# Patient Record
Sex: Female | Born: 1995 | Race: Black or African American | Hispanic: No | Marital: Single | Smoking: Never smoker
Health system: Southern US, Community
[De-identification: ages and names within clinical notes are randomized; demographics above are authoritative.]

## PROBLEM LIST (undated history)

## (undated) DIAGNOSIS — K8020 Calculus of gallbladder without cholecystitis without obstruction: Secondary | ICD-10-CM

## (undated) DIAGNOSIS — I10 Essential (primary) hypertension: Secondary | ICD-10-CM

---

## 2014-06-25 ENCOUNTER — Encounter (HOSPITAL_BASED_OUTPATIENT_CLINIC_OR_DEPARTMENT_OTHER): Payer: Self-pay | Admitting: Emergency Medicine

## 2014-06-25 ENCOUNTER — Emergency Department (HOSPITAL_BASED_OUTPATIENT_CLINIC_OR_DEPARTMENT_OTHER)
Admission: EM | Admit: 2014-06-25 | Discharge: 2014-06-25 | Disposition: A | Discharge status: Home / Self Care | Payer: BC Managed Care – PPO | Attending: Emergency Medicine | Admitting: Emergency Medicine

## 2014-06-25 DIAGNOSIS — I10 Essential (primary) hypertension: Secondary | ICD-10-CM | POA: Insufficient documentation

## 2014-06-25 DIAGNOSIS — R21 Rash and other nonspecific skin eruption: Secondary | ICD-10-CM | POA: Diagnosis present

## 2014-06-25 HISTORY — DX: Essential (primary) hypertension: I10

## 2014-06-25 LAB — RAPID STREP SCREEN (MED CTR MEBANE ONLY): STREPTOCOCCUS, GROUP A SCREEN (DIRECT): NEGATIVE

## 2014-06-25 MED ORDER — PREDNISONE 10 MG PO TABS: ORAL_TABLET | ORAL | Status: DC

## 2014-06-25 NOTE — ED Provider Notes (Signed)
CSN: [id number]     Arrival date & time 06/25/14  1621 History   First MD Initiated Contact with Patient 06/25/14 1725     Chief Complaint  Patient presents with  . Rash     (Consider location/radiation/quality/duration/timing/severity/associated sxs/prior Treatment) Patient is a 18 y.o. female presenting with rash. The history is provided by the patient. No language interpreter was used.  Rash Location:  Full body Quality: itchiness and redness   Severity:  Moderate Onset quality:  Gradual Duration:  6 days Timing:  Constant Progression:  Worsening Chronicity:  New Context: not insect bite/sting   Relieved by:  Nothing Worsened by:  Nothing tried Ineffective treatments:  None tried Associated symptoms: no throat swelling     Past Medical History  Diagnosis Date  . Hypertension    History reviewed. No pertinent past surgical history. No family history on file. History  Substance Use Topics  . Smoking status: Never Smoker   . Smokeless tobacco: Not on file  . Alcohol Use: No   OB History   Grav Para Term Preterm Abortions TAB SAB Ect Mult Living                 Review of Systems  Skin: Positive for rash.  All other systems reviewed and are negative.     Allergies  Review of patient's allergies indicates no known allergies.  Home Medications   Prior to Admission medications   Not on File   BP 153/87  Pulse 80  Temp(Src) 98 F (36.7 C) (Oral)  Resp 18  Wt 180 lb (81.647 kg)  SpO2 100%  LMP 06/11/2014 Physical Exam  Nursing note and vitals reviewed. Constitutional: She is oriented to person, place, and time. She appears well-developed and well-nourished.  HENT:  Head: Normocephalic.  Eyes: Pupils are equal, round, and reactive to light.  Neck: Normal range of motion.  Cardiovascular: Normal rate.   Pulmonary/Chest: Effort normal.  Abdominal: Soft.  Neurological: She is alert and oriented to person, place, and time.  Skin: Rash noted.  Raised,  erythematous rash, fine,    Psychiatric: She has a normal mood and affect.    ED Course  Procedures (including critical care time) Labs Review Labs Reviewed  RAPID STREP SCREEN  CULTURE, GROUP A STREP    Imaging Review No results found. Rash looks like strep,   Strep is negative,   Probable allergic.  I wll treat with prednisone  EKG Interpretation None      MDM   Final diagnoses:  Rash and nonspecific skin eruption    Strep negative Prednisone taper    Elson Areas, PA-C 06/25/14 2241

## 2014-06-25 NOTE — Discharge Instructions (Signed)

## 2014-06-25 NOTE — ED Notes (Signed)
Rash since Tuesday. No relief with benadryl

## 2014-06-27 LAB — CULTURE, GROUP A STREP

## 2014-07-03 NOTE — ED Provider Notes (Signed)
Medical screening examination/treatment/procedure(s) were performed by non-physician practitioner and as supervising physician I was immediately available for consultation/collaboration.   EKG Interpretation None        Haruna Rohlfs, MD 07/03/14 1504 

## 2016-07-31 DIAGNOSIS — R351 Nocturia: Secondary | ICD-10-CM | POA: Diagnosis not present

## 2016-07-31 DIAGNOSIS — R42 Dizziness and giddiness: Secondary | ICD-10-CM | POA: Diagnosis not present

## 2017-11-18 DIAGNOSIS — K8020 Calculus of gallbladder without cholecystitis without obstruction: Secondary | ICD-10-CM | POA: Diagnosis not present

## 2017-11-18 DIAGNOSIS — R1011 Right upper quadrant pain: Secondary | ICD-10-CM | POA: Diagnosis not present

## 2017-11-20 DIAGNOSIS — K8012 Calculus of gallbladder with acute and chronic cholecystitis without obstruction: Secondary | ICD-10-CM | POA: Diagnosis not present

## 2017-12-01 DIAGNOSIS — I10 Essential (primary) hypertension: Secondary | ICD-10-CM | POA: Diagnosis not present

## 2017-12-01 DIAGNOSIS — K8020 Calculus of gallbladder without cholecystitis without obstruction: Secondary | ICD-10-CM | POA: Insufficient documentation

## 2017-12-01 DIAGNOSIS — R1011 Right upper quadrant pain: Secondary | ICD-10-CM | POA: Diagnosis not present

## 2017-12-01 DIAGNOSIS — R109 Unspecified abdominal pain: Secondary | ICD-10-CM | POA: Diagnosis not present

## 2017-12-01 DIAGNOSIS — K8050 Calculus of bile duct without cholangitis or cholecystitis without obstruction: Secondary | ICD-10-CM | POA: Diagnosis not present

## 2017-12-02 ENCOUNTER — Other Ambulatory Visit: Payer: Self-pay

## 2017-12-02 ENCOUNTER — Encounter (HOSPITAL_BASED_OUTPATIENT_CLINIC_OR_DEPARTMENT_OTHER): Payer: Self-pay

## 2017-12-02 ENCOUNTER — Emergency Department (HOSPITAL_BASED_OUTPATIENT_CLINIC_OR_DEPARTMENT_OTHER)
Admission: EM | Admit: 2017-12-02 | Discharge: 2017-12-02 | Disposition: A | Discharge status: Home / Self Care | Payer: BLUE CROSS/BLUE SHIELD | Attending: Emergency Medicine | Admitting: Emergency Medicine

## 2017-12-02 DIAGNOSIS — K8050 Calculus of bile duct without cholangitis or cholecystitis without obstruction: Secondary | ICD-10-CM

## 2017-12-02 HISTORY — DX: Calculus of gallbladder without cholecystitis without obstruction: K80.20

## 2017-12-02 LAB — CBC WITH DIFFERENTIAL/PLATELET
Basophils Absolute: 0 10*3/uL (ref 0.0–0.1)
Basophils Relative: 0 %
Eosinophils Absolute: 0.2 10*3/uL (ref 0.0–0.7)
Eosinophils Relative: 2 %
HEMATOCRIT: 35.3 % — AB (ref 36.0–46.0)
Hemoglobin: 11.5 g/dL — ABNORMAL LOW (ref 12.0–15.0)
Lymphocytes Relative: 47 %
Lymphs Abs: 3.4 10*3/uL (ref 0.7–4.0)
MCH: 28 pg (ref 26.0–34.0)
MCHC: 32.6 g/dL (ref 30.0–36.0)
MCV: 86.1 fL (ref 78.0–100.0)
Monocytes Absolute: 0.6 10*3/uL (ref 0.1–1.0)
Monocytes Relative: 8 %
NEUTROS ABS: 3.1 10*3/uL (ref 1.7–7.7)
NEUTROS PCT: 43 %
Platelets: 352 10*3/uL (ref 150–400)
RBC: 4.1 MIL/uL (ref 3.87–5.11)
RDW: 13.2 % (ref 11.5–15.5)
WBC: 7.3 10*3/uL (ref 4.0–10.5)

## 2017-12-02 LAB — COMPREHENSIVE METABOLIC PANEL
ALK PHOS: 51 U/L (ref 38–126)
ALT: 13 U/L — ABNORMAL LOW (ref 14–54)
AST: 15 U/L (ref 15–41)
Albumin: 3.7 g/dL (ref 3.5–5.0)
Anion gap: 11 (ref 5–15)
BUN: 10 mg/dL (ref 6–20)
CALCIUM: 8.6 mg/dL — AB (ref 8.9–10.3)
CO2: 22 mmol/L (ref 22–32)
Chloride: 104 mmol/L (ref 101–111)
Creatinine, Ser: 0.46 mg/dL (ref 0.44–1.00)
GFR calc Af Amer: >60 mL/min (ref >60)
GFR calc non Af Amer: >60 mL/min (ref >60)
Glucose, Bld: 122 mg/dL — ABNORMAL HIGH (ref 65–99)
Potassium: 3.1 mmol/L — ABNORMAL LOW (ref 3.5–5.1)
Sodium: 137 mmol/L (ref 135–145)
Total Bilirubin: 0.2 mg/dL — ABNORMAL LOW (ref 0.3–1.2)
Total Protein: 7.3 g/dL (ref 6.5–8.1)

## 2017-12-02 LAB — LIPASE, BLOOD: Lipase: 26 U/L (ref 11–51)

## 2017-12-02 MED ORDER — HYDROMORPHONE HCL 1 MG/ML IJ SOLN
1.0000 mg | Freq: Once | INTRAMUSCULAR | Status: AC
  Administered 2017-12-02: 1 mg via INTRAVENOUS
  Filled 2017-12-02: qty 1

## 2017-12-02 MED ORDER — KETOROLAC TROMETHAMINE 30 MG/ML IJ SOLN
30.0000 mg | Freq: Once | INTRAMUSCULAR | Status: AC
  Administered 2017-12-02: 30 mg via INTRAVENOUS
  Filled 2017-12-02: qty 1

## 2017-12-02 MED ORDER — ONDANSETRON HCL 4 MG/2ML IJ SOLN
4.0000 mg | Freq: Once | INTRAMUSCULAR | Status: AC
  Administered 2017-12-02: 4 mg via INTRAVENOUS
  Filled 2017-12-02: qty 2

## 2017-12-02 MED ORDER — HYDROCODONE-ACETAMINOPHEN 5-325 MG PO TABS: 1.0000 | ORAL_TABLET | ORAL | 0 refills | Status: DC | PRN

## 2017-12-02 MED ORDER — SODIUM CHLORIDE 0.9 % IV BOLUS (SEPSIS)
1000.0000 mL | Freq: Once | INTRAVENOUS | Status: AC
  Administered 2017-12-02: 1000 mL via INTRAVENOUS

## 2017-12-02 MED ORDER — PROMETHAZINE HCL 25 MG PO TABS: 25.0000 mg | ORAL_TABLET | Freq: Four times a day (QID) | ORAL | 0 refills | Status: DC | PRN

## 2017-12-02 NOTE — ED Triage Notes (Signed)
Chronic pain r/t gallstones, is supposed to have surgery but does not have the money, pt states the pain got worse today and she was vomiting as well

## 2017-12-02 NOTE — ED Provider Notes (Signed)
[facility] EMERGENCY DEPARTMENT Provider Note   CSN: [id number] Arrival date & time: 12/01/17  2350     History   Chief Complaint Chief Complaint  Patient presents with  . Abdominal Pain    HPI Amber Walters is a 22 y.o. female.  She presents to the ER for evaluation of abdominal pain.  Patient has a known gallstone.  Patient reports right upper and central abdominal pain radiating into her back.  Pain is severe.  She reports that she has been having intermittent pain similar to this when she eats and she was diagnosed, however, tonight the pain was worse.      Past Medical History:  Diagnosis Date  . Gallstones   . Hypertension     There are no active problems to display for this patient.   History reviewed. No pertinent surgical history.  OB History    No data available       Home Medications    Prior to Admission medications   Medication Sig Start Date End Date Taking? Authorizing Provider  predniSONE (DELTASONE) 10 MG tablet 6,5,4,3,2,1 taper 06/25/14   Elson AreasSofia, Leslie K, PA-C    Family History No family history on file.  Social History Social History   Tobacco Use  . Smoking status: Never Smoker  Substance Use Topics  . Alcohol use: No  . Drug use: Not on file     Allergies   Patient has no known allergies.   Review of Systems Review of Systems  Gastrointestinal: Positive for abdominal pain.  All other systems reviewed and are negative.    Physical Exam Updated Vital Signs BP (!) 147/87 (BP Location: Left Arm)   Pulse 89   Temp 99 F (37.2 C) (Oral)   Resp 18   Ht 5' (1.524 m)   Wt 89.4 kg (197 lb)   LMP 11/06/2017   SpO2 100%   BMI 38.47 kg/m   Physical Exam  Constitutional: She is oriented to person, place, and time. She appears well-developed and well-nourished. No distress.  HENT:  Head: Normocephalic and atraumatic.  Right Ear: Hearing normal.  Left Ear: Hearing normal.  Nose: Nose normal.    Mouth/Throat: Oropharynx is clear and moist and mucous membranes are normal.  Eyes: Conjunctivae and EOM are normal. Pupils are equal, round, and reactive to light.  Neck: Normal range of motion. Neck supple.  Cardiovascular: Regular rhythm, S1 normal and S2 normal. Exam reveals no gallop and no friction rub.  No murmur heard. Pulmonary/Chest: Effort normal and breath sounds normal. No respiratory distress. She exhibits no tenderness.  Abdominal: Soft. Normal appearance and bowel sounds are normal. There is no hepatosplenomegaly. There is tenderness in the right upper quadrant and epigastric area. There is no rebound, no guarding, no tenderness at McBurney's point and negative Murphy's sign. No hernia.  Musculoskeletal: Normal range of motion.  Neurological: She is alert and oriented to person, place, and time. She has normal strength. No cranial nerve deficit or sensory deficit. Coordination normal. GCS eye subscore is 4. GCS verbal subscore is 5. GCS motor subscore is 6.  Skin: Skin is warm, dry and intact. No rash noted. No cyanosis.  Psychiatric: She has a normal mood and affect. Her speech is normal and behavior is normal. Thought content normal.  Nursing note and vitals reviewed.    ED Treatments / Results  Labs (all labs ordered are listed, but only abnormal results are displayed) Labs Reviewed  COMPREHENSIVE METABOLIC PANEL - Abnormal;  Notable for the following components:      Result Value   Potassium 3.1 (*)    Glucose, Bld 122 (*)    Calcium 8.6 (*)    ALT 13 (*)    Total Bilirubin 0.2 (*)    All other components within normal limits  CBC WITH DIFFERENTIAL/PLATELET - Abnormal; Notable for the following components:   Hemoglobin 11.5 (*)    HCT 35.3 (*)    All other components within normal limits  LIPASE, BLOOD    EKG  EKG Interpretation None       Radiology No results found.  Procedures Procedures (including critical care time)  Medications Ordered in  ED Medications  sodium chloride 0.9 % bolus 1,000 mL (0 mLs Intravenous Stopped 12/02/17 0134)  HYDROmorphone (DILAUDID) injection 1 mg (1 mg Intravenous Given 12/02/17 0042)  ondansetron (ZOFRAN) injection 4 mg (4 mg Intravenous Given 12/02/17 0042)  ketorolac (TORADOL) 30 MG/ML injection 30 mg (30 mg Intravenous Given 12/02/17 0147)  HYDROmorphone (DILAUDID) injection 1 mg (1 mg Intravenous Given 12/02/17 0147)     Initial Impression / Assessment and Plan / ED Course  I have reviewed the triage vital signs and the nursing notes.  Pertinent labs & imaging results that were available during my care of the patient were reviewed by me and considered in my medical decision making (see chart for details).     Patient presents with upper abdominal pain.  She has a known gallstone.  Examination and presentation are consistent with biliary colic.  She did not have a Murphy sign.  She does not have a fever.  White blood cell count, LFTs and lipase are all normal.  Patient is now pain-free after medication.  She was once again counseled that she needs to follow-up with general surgery to have her gallbladder removed.  Follow a strict gallstone diet.  Was given a prescription for Vicodin to be used as needed.  Final Clinical Impressions(s) / ED Diagnoses   Final diagnoses:  Biliary colic    ED Discharge Orders    None       Gilda Crease, MD 12/02/17 [phone]

## 2017-12-02 NOTE — ED Notes (Signed)
ED Provider at bedside. 

## 2017-12-02 NOTE — ED Notes (Signed)
Pt reports she has an appt Friday to get second opinion from surgeon.

## 2017-12-02 NOTE — ED Notes (Signed)
Pt given "d/c instructions" on proper foods to eat and avoid with gallstones.

## 2017-12-04 ENCOUNTER — Ambulatory Visit: Payer: Self-pay | Admitting: Surgery

## 2017-12-04 DIAGNOSIS — K8010 Calculus of gallbladder with chronic cholecystitis without obstruction: Secondary | ICD-10-CM | POA: Diagnosis not present

## 2017-12-04 DIAGNOSIS — Z0000 Encounter for general adult medical examination without abnormal findings: Secondary | ICD-10-CM | POA: Diagnosis not present

## 2017-12-04 DIAGNOSIS — Z23 Encounter for immunization: Secondary | ICD-10-CM | POA: Diagnosis not present

## 2017-12-04 NOTE — H&P (Signed)
Amber Walters Documented: 12/04/2017 10:02 AM Location: [facility] Patient #: [id number] DOB: [birth year] Single / Language: [place] / Race: Black or African American Female  History of Present Illness Amber Walters A. Amber Lutz MD; 12/04/2017 11:37 AM) Patient words: Patient sent at the request emergency room for history of urinary colic. She is 2 months history of right upper quadrant pain, nausea and vomiting after eating fatty meals. Ultrasound done on 11/18/2017 shows a large gallstone with no signs of acute cholecystitis. Her symptoms are controlled if she has a low fat diet. Denies any other symptomatology of back pain chest pain or shortness of breath.          CLINICAL DATA: Right upper quadrant pain for 1 month.  EXAM: ULTRASOUND ABDOMEN LIMITED RIGHT UPPER QUADRANT  COMPARISON: None.  FINDINGS: Gallbladder:  A 1.4 cm gallstone is seen in the gallbladder neck, as well as a small amount of gallbladder sludge. No evidence of gallbladder dilatation or wall thickening. No sonographic Murphy sign noted by sonographer.  Common bile duct:  Diameter: 2 mm, within normal limits.  Liver:  No focal lesion identified. Within normal limits in parenchymal echogenicity. Portal vein is patent on color Doppler imaging with normal direction of blood flow towards the liver.  IMPRESSION: Cholelithiasis, without sonographic signs of acute cholecystitis or biliary dilatation.   Electronically Signed By: Myles Rosenthal M.D. On: 11/18/2017 10:05            CLINICAL DATA: Right upper quadrant pain for 1 month.  EXAM: ULTRASOUND ABDOMEN LIMITED RIGHT UPPER QUADRANT  COMPARISON: None.  FINDINGS: Gallbladder:  A 1.4 cm gallstone is seen in the gallbladder neck, as well as a small amount of gallbladder sludge. No evidence of gallbladder dilatation or wall thickening. No sonographic Murphy sign noted by sonographer.  Common bile duct:  Diameter: 2  mm, within normal limits.  Liver:  No focal lesion identified. Within normal limits in parenchymal echogenicity. Portal vein is patent on color Doppler imaging with normal direction of blood flow towards the liver.  IMPRESSION: Cholelithiasis, without sonographic signs of acute cholecystitis or biliary dilatation.   Electronically Signed By: Myles Rosenthal M.D. On: 11/18/2017 10:05   Results for Amber Walters (MRN [id number]) as of 12/04/2017 10:11 Ref. Range 12/02/2017 00:38 Sodium Latest Ref Range: 135 - 145 mmol/L 137 Potassium Latest Ref Range: 3.5 - 5.1 mmol/L 3.1 (L) Chloride Latest Ref Range: 101 - 111 mmol/L 104 CO2 Latest Ref Range: 22 - 32 mmol/L 22 Glucose Latest Ref Range: 65 - 99 mg/dL 213 (H) BUN Latest Ref Range: 6 - 20 mg/dL 10 Creatinine Latest Ref Range: 0.44 - 1.00 mg/dL 0.86 Calcium Latest Ref Range: 8.9 - 10.3 mg/dL 8.6 (L) Anion gap Latest Ref Range: 5 - 15 11 Alkaline Phosphatase Latest Ref Range: 38 - 126 U/L 51 Albumin Latest Ref Range: 3.5 - 5.0 g/dL 3.7 Lipase Latest Ref Range: 11 - 51 U/L 26 AST Latest Ref Range: 15 - 41 U/L 15 ALT Latest Ref Range: 14 - 54 U/L 13 (L) Total Protein Latest Ref Range: 6.5 - 8.1 g/dL 7.3 Total Bilirubin Latest Ref Range: 0.3 - 1.2 mg/dL 0.2 (L) GFR, Est Non African American Latest Ref Range: >60 mL/min >60 GFR, Est African American Latest Ref Range: >60 mL/min >60 WBC Latest Ref Range: 4.0 - 10.5 K/uL 7.3 RBC Latest Ref Range: 3.87 - 5.11 MIL/uL 4.10 Hemoglobin Latest Ref Range: 12.0 - 15.0 g/dL 57.8 (L) HCT Latest Ref Range: 36.0 - 46.0 % 35.3 (L)  MCV Latest Ref Range: 78.0 - 100.0 fL 86.1 MCH Latest Ref Range: 26.0 - 34.0 pg 28.0 MCHC Latest Ref Range: 30.0 - 36.0 g/dL 08.6 RDW Latest Ref Range: 11.5 - 15.5 % 13.2 Platelets Latest Ref Range: 150 - 400 K/uL 352 Neutrophils Latest Units: % 43 Lymphocytes Latest Units: % 47 Monocytes Relative Latest Units: % 8 Eosinophil Latest Units: % 2 Basophil Latest  Units: % 0 NEUT# Latest Ref Range: 1.7 - 7.7 K/uL 3.1 Lymphocyte # Latest Ref Range: 0.7 - 4.0 K/uL 3.4 Monocyte # Latest Ref Range: 0.1 - 1.0 K/uL 0.6 Eosinophils Absolute Latest Ref Range: 0.0 - 0.7 K/uL 0.2 Basophils Absolute Latest Ref Range: 0.0 - 0.1 K/uL 0.0.  The patient is a 22 year old female.   Past Surgical History Judithann Sauger, [place]; 12/04/2017 10:03 AM) No pertinent past surgical history  Diagnostic Studies History Judithann Sauger, [place]; 12/04/2017 10:03 AM) Colonoscopy never Mammogram never Pap Smear 1-5 years ago  Allergies Judithann Sauger, RMA; 12/04/2017 10:04 AM) No Known Drug Allergies [12/04/2017]: Allergies Reconciled  Medication History Judithann Sauger, [place]; 12/04/2017 10:05 AM) Hydrocodone-Acetaminophen (5-325MG  Tablet, Oral) Active. Promethazine HCl (25MG  Tablet, Oral) Active. Medications Reconciled  Social History Judithann Sauger, [place]; 12/04/2017 10:03 AM) Alcohol use Occasional alcohol use. Caffeine use Carbonated beverages. No drug use Tobacco use Never smoker.  Family History Judithann Sauger, [place]; 12/04/2017 10:03 AM) Diabetes Mellitus Father. Hypertension Father.  Pregnancy / Birth History Judithann Sauger, [place]; 12/04/2017 10:03 AM) Age at menarche 11 years. Gravida 0 Para 0 Regular periods  Other Problems Judithann Sauger, RMA; 12/04/2017 10:03 AM) High blood pressure     Review of Systems Judithann Sauger RMA; 12/04/2017 10:03 AM) General Not Present- Appetite Loss, Chills, Fatigue, Fever, Night Sweats, Weight Gain and Weight Loss. Skin Not Present- Change in Wart/Mole, Dryness, Hives, Jaundice, New Lesions, Non-Healing Wounds, Rash and Ulcer. HEENT Not Present- Earache, Hearing Loss, Hoarseness, Nose Bleed, Oral Ulcers, Ringing in the Ears, Seasonal Allergies, Sinus Pain, Sore Throat, Visual Disturbances, Wears glasses/contact lenses and Yellow Eyes. Respiratory Not Present- Bloody sputum, Chronic Cough, Difficulty Breathing,  Snoring and Wheezing. Breast Not Present- Breast Mass, Breast Pain, Nipple Discharge and Skin Changes. Cardiovascular Not Present- Chest Pain, Difficulty Breathing Lying Down, Leg Cramps, Palpitations, Rapid Heart Rate, Shortness of Breath and Swelling of Extremities. Gastrointestinal Present- Abdominal Pain. Not Present- Bloating, Bloody Stool, Change in Bowel Habits, Chronic diarrhea, Constipation, Difficulty Swallowing, Excessive gas, Gets full quickly at meals, Hemorrhoids, Indigestion, Nausea, Rectal Pain and Vomiting. Female Genitourinary Not Present- Frequency, Nocturia, Painful Urination, Pelvic Pain and Urgency. Musculoskeletal Present- Back Pain. Not Present- Joint Pain, Joint Stiffness, Muscle Pain, Muscle Weakness and Swelling of Extremities. Neurological Not Present- Decreased Memory, Fainting, Headaches, Numbness, Seizures, Tingling, Tremor, Trouble walking and Weakness. Psychiatric Not Present- Anxiety, Bipolar, Change in Sleep Pattern, Depression, Fearful and Frequent crying. Endocrine Not Present- Cold Intolerance, Excessive Hunger, Hair Changes, Heat Intolerance, Hot flashes and New Diabetes. Hematology Not Present- Blood Thinners, Easy Bruising, Excessive bleeding, Gland problems, HIV and Persistent Infections.  Vitals Elease Hashimoto King RMA; 12/04/2017 10:04 AM) 12/04/2017 10:04 AM Weight: 200 lb Height: 60in Body Surface Area: 1.87 m Body Mass Index: 39.06 kg/m  Temp.: 98.62F  Pulse: 98 (Regular)  BP: 125/78 (Sitting, Left Arm, Standard)      Physical Exam (Samarrah Tranchina A. Katsumi Wisler MD; 12/04/2017 11:39 AM)  General Mental Status-Alert. General Appearance-Consistent with stated age. Hydration-Well hydrated. Voice-Normal.  Cardiovascular Cardiovascular examination reveals -on palpation PMI is normal in location and amplitude, no palpable S3  or S4. Normal cardiac borders., normal heart sounds, regular rate and rhythm with no murmurs, carotid auscultation  reveals no bruits and normal pedal pulses bilaterally.  Abdomen Inspection Inspection of the abdomen reveals - No Hernias. Skin - Scar - no surgical scars. Palpation/Percussion Palpation and Percussion of the abdomen reveal - Soft, Non Tender, No Rebound tenderness, No Rigidity (guarding) and No hepatosplenomegaly. Auscultation Auscultation of the abdomen reveals - Bowel sounds normal.  Neurologic Neurologic evaluation reveals -alert and oriented x 3 with no impairment of recent or remote memory. Mental Status-Normal.  Musculoskeletal Normal Exam - Left-Upper Extremity Strength Normal and Lower Extremity Strength Normal. Normal Exam - Right-Upper Extremity Strength Normal, Lower Extremity Weakness.    Assessment & Plan (Darius Lundberg A. Shevonne Wolf MD; 12/04/2017 11:39 AM)  CHRONIC CHOLECYSTITIS WITH CALCULUS (K80.10) Impression: Discussed medical and surgical options of treatment. Risk and benefits of all discussed. Risk of surgery discussed. She would like to proceed with laparoscopic cholecystectomy with cholangiogram. The procedure has been discussed with the patient. Risks of laparoscopic cholecystectomy include bleeding, infection, bile duct injury, leak, death, open surgery, diarrhea, other surgery, organ injury, blood vessel injury, DVT, and additional care.  Current Plans You are being scheduled for surgery- Our schedulers will call you.  You should hear from our office's scheduling department within 5 working days about the location, date, and time of surgery. We try to make accommodations for patient's preferences in scheduling surgery, but sometimes the OR schedule or the surgeon's schedule prevents [place] from making those accommodations.  If you have not heard from our office [phone] in 5 working days, call the office and ask for your surgeon's nurse.  If you have other questions about your diagnosis, plan, or surgery, call the office and ask for your surgeon's  nurse.  Pt Education - Pamphlet Given - Laparoscopic Gallbladder Surgery: discussed with patient and provided information. The anatomy & physiology of hepatobiliary & pancreatic function was discussed. The pathophysiology of gallbladder dysfunction was discussed. Natural history risks without surgery was discussed. I feel the risks of no intervention will lead to serious problems that outweigh the operative risks; therefore, I recommended cholecystectomy to remove the pathology. I explained laparoscopic techniques with possible need for an open approach. Probable cholangiogram to evaluate the bilary tract was explained as well.  Risks such as bleeding, infection, abscess, leak, injury to other organs, need for further treatment, heart attack, death, and other risks were discussed. I noted a good likelihood this will help address the problem. Possibility that this will not correct all abdominal symptoms was explained. Goals of post-operative recovery were discussed as well. We will work to minimize complications. An educational handout further explaining the pathology and treatment options was given as well. Questions were answered. The patient expresses understanding & wishes to proceed with surgery.  Pt Education - [facility] Good Bowel Health Pt Education - Laparoscopic Cholecystectomy: gallbladder

## 2017-12-22 DIAGNOSIS — K8020 Calculus of gallbladder without cholecystitis without obstruction: Secondary | ICD-10-CM | POA: Diagnosis not present

## 2017-12-24 DIAGNOSIS — Z3A10 10 weeks gestation of pregnancy: Secondary | ICD-10-CM | POA: Diagnosis not present

## 2017-12-24 DIAGNOSIS — K8010 Calculus of gallbladder with chronic cholecystitis without obstruction: Secondary | ICD-10-CM | POA: Diagnosis not present

## 2018-07-31 DIAGNOSIS — M545 Low back pain: Secondary | ICD-10-CM | POA: Diagnosis not present

## 2018-07-31 DIAGNOSIS — N390 Urinary tract infection, site not specified: Secondary | ICD-10-CM | POA: Diagnosis not present

## 2018-10-14 ENCOUNTER — Emergency Department (HOSPITAL_BASED_OUTPATIENT_CLINIC_OR_DEPARTMENT_OTHER)
Admission: EM | Admit: 2018-10-14 | Discharge: 2018-10-14 | Disposition: A | Discharge status: Home / Self Care | Payer: BLUE CROSS/BLUE SHIELD | Attending: Emergency Medicine | Admitting: Emergency Medicine

## 2018-10-14 ENCOUNTER — Other Ambulatory Visit: Payer: Self-pay

## 2018-10-14 ENCOUNTER — Encounter (HOSPITAL_BASED_OUTPATIENT_CLINIC_OR_DEPARTMENT_OTHER): Payer: Self-pay | Admitting: *Deleted

## 2018-10-14 ENCOUNTER — Emergency Department (HOSPITAL_BASED_OUTPATIENT_CLINIC_OR_DEPARTMENT_OTHER): Payer: BLUE CROSS/BLUE SHIELD

## 2018-10-14 DIAGNOSIS — I10 Essential (primary) hypertension: Secondary | ICD-10-CM | POA: Insufficient documentation

## 2018-10-14 DIAGNOSIS — R6889 Other general symptoms and signs: Secondary | ICD-10-CM

## 2018-10-14 DIAGNOSIS — M791 Myalgia, unspecified site: Secondary | ICD-10-CM | POA: Insufficient documentation

## 2018-10-14 LAB — GROUP A STREP BY PCR: Group A Strep by PCR: NOT DETECTED

## 2018-10-14 MED ORDER — IBUPROFEN 800 MG PO TABS
800.0000 mg | ORAL_TABLET | Freq: Once | ORAL | Status: AC
  Administered 2018-10-14: 800 mg via ORAL
  Filled 2018-10-14: qty 1

## 2018-10-14 MED ORDER — IBUPROFEN 600 MG PO TABS: 600.0000 mg | ORAL_TABLET | Freq: Four times a day (QID) | ORAL | 0 refills | Status: AC | PRN

## 2018-10-14 MED ORDER — ACETAMINOPHEN 500 MG PO TABS
1000.0000 mg | ORAL_TABLET | Freq: Once | ORAL | Status: AC
  Administered 2018-10-14: 1000 mg via ORAL
  Filled 2018-10-14: qty 2

## 2018-10-14 MED ORDER — METHOCARBAMOL 500 MG PO TABS: 1000.0000 mg | ORAL_TABLET | Freq: Three times a day (TID) | ORAL | 0 refills | Status: AC | PRN

## 2018-10-14 NOTE — ED Triage Notes (Signed)
Pt c/ URI symptoms x 4 days

## 2018-10-14 NOTE — ED Notes (Signed)
Patient transported to X-ray 

## 2018-10-14 NOTE — ED Provider Notes (Signed)
[facility] EMERGENCY DEPARTMENT Provider Note   CSN: [id number] Arrival date & time: 10/14/18  1654     History   Chief Complaint No chief complaint on file.   HPI Amber Walters is a 23 y.o. female.  HPI Patient is had 3 to 4 days of nasal congestion, cough, subjective fevers and chills and diffuse body aches especially in her lower extremities.  She also has had several episodes of vomiting and diarrhea.  She has multiple family members with similar symptoms.  Denies sore throat.  Denies abdominal pain.  No new rashes.  No neck pain or stiffness.  She is been taking Tylenol at home with little improvement of her symptoms. Past Medical History:  Diagnosis Date  . Gallstones   . Hypertension     There are no active problems to display for this patient.   History reviewed. No pertinent surgical history.   OB History   No obstetric history on file.      Home Medications    Prior to Admission medications   Medication Sig Start Date End Date Taking? Authorizing Provider  ibuprofen (ADVIL,MOTRIN) 600 MG tablet Take 1 tablet (600 mg total) by mouth every 6 (six) hours as needed. 10/14/18   Loren Racer, MD  methocarbamol (ROBAXIN) 500 MG tablet Take 2 tablets (1,000 mg total) by mouth every 8 (eight) hours as needed for muscle spasms. 10/14/18   Loren Racer, MD    Family History No family history on file.  Social History Social History   Tobacco Use  . Smoking status: Never Smoker  Substance Use Topics  . Alcohol use: No  . Drug use: Not on file     Allergies   Patient has no known allergies.   Review of Systems Review of Systems  Constitutional: Positive for chills, fatigue and fever.  HENT: Positive for congestion. Negative for sore throat and trouble swallowing.   Eyes: Negative for visual disturbance.  Respiratory: Positive for cough. Negative for shortness of breath and wheezing.   Cardiovascular: Negative for chest pain,  palpitations and leg swelling.  Gastrointestinal: Positive for diarrhea, nausea and vomiting. Negative for abdominal pain, blood in stool and constipation.  Genitourinary: Negative for dysuria, flank pain and frequency.  Musculoskeletal: Positive for arthralgias and myalgias. Negative for back pain, neck pain and neck stiffness.  Skin: Negative for rash and wound.  Neurological: Negative for dizziness, weakness, light-headedness, numbness and headaches.  All other systems reviewed and are negative.    Physical Exam Updated Vital Signs BP 109/64 (BP Location: Right Arm)   Pulse 67   Temp 98.8 F (37.1 C) (Oral)   Resp 16   Ht 5' (1.524 m)   Wt 86.2 kg   LMP 10/09/2018   SpO2 100%   BMI 37.11 kg/m   Physical Exam Vitals signs and nursing note reviewed.  Constitutional:      General: She is not in acute distress.    Appearance: Normal appearance. She is well-developed and normal weight. She is not ill-appearing.  HENT:     Head: Normocephalic and atraumatic.     Nose: Congestion present.     Mouth/Throat:     Mouth: Mucous membranes are moist.     Pharynx: Posterior oropharyngeal erythema present. No oropharyngeal exudate.  Eyes:     Extraocular Movements: Extraocular movements intact.     Conjunctiva/sclera: Conjunctivae normal.     Pupils: Pupils are equal, round, and reactive to light.  Neck:  Musculoskeletal: Normal range of motion and neck supple. No neck rigidity or muscular tenderness.     Comments: No meningismus Cardiovascular:     Rate and Rhythm: Normal rate and regular rhythm.     Heart sounds: No murmur. No friction rub. No gallop.   Pulmonary:     Effort: Pulmonary effort is normal.     Comments: Diminished breath sounds in bilateral bases.  No respiratory distress. Abdominal:     General: Bowel sounds are normal. There is no distension.     Palpations: Abdomen is soft.     Tenderness: There is no abdominal tenderness. There is no guarding or rebound.    Musculoskeletal: Normal range of motion.        General: Tenderness present.     Comments: Mild diffuse bilateral lower extremity tenderness to palpation.  Full range of motion of all joints without obvious discomfort.  No warmth, effusions or erythema.  Distal pulses are 2+.  No midline thoracic or lumbar tenderness.  No CVA tenderness.  Lymphadenopathy:     Cervical: No cervical adenopathy.  Skin:    General: Skin is warm and dry.     Capillary Refill: Capillary refill takes less than 2 seconds.     Findings: No erythema or rash.  Neurological:     General: No focal deficit present.     Mental Status: She is alert and oriented to person, place, and time.     Comments: Moving all extremities without focal deficit.  Sensation fully intact.  Psychiatric:        Mood and Affect: Mood normal.        Behavior: Behavior normal.      ED Treatments / Results  Labs (all labs ordered are listed, but only abnormal results are displayed) Labs Reviewed  GROUP A STREP BY PCR    EKG None  Radiology Dg Chest 2 View  Result Date: 10/14/2018 CLINICAL DATA:  Cough, body aches, fever, nausea, headache and weakness. EXAM: CHEST - 2 VIEW COMPARISON:  None. FINDINGS: The cardiomediastinal contours are normal. The lungs are clear. Pulmonary vasculature is normal. No consolidation, pleural effusion, or pneumothorax. No acute osseous abnormalities are seen. IMPRESSION: Negative radiographs of the chest. Electronically Signed   By: Narda RutherfordMelanie  Sanford M.D.   On: 10/14/2018 20:06    Procedures Procedures (including critical care time)  Medications Ordered in ED Medications  ibuprofen (ADVIL,MOTRIN) tablet 800 mg (800 mg Oral Given 10/14/18 1732)  acetaminophen (TYLENOL) tablet 1,000 mg (1,000 mg Oral Given 10/14/18 1941)     Initial Impression / Assessment and Plan / ED Course  I have reviewed the triage vital signs and the nursing notes.  Pertinent labs & imaging results that were available during  my care of the patient were reviewed by me and considered in my medical decision making (see chart for details).     Chest x-ray without acute findings.  Negative strep test.  Suspect influenza-like illness.  Advise symptomatic treatment at home.  Return precautions given.  Final Clinical Impressions(s) / ED Diagnoses   Final diagnoses:  Flu-like symptoms  Myalgia    ED Discharge Orders         Ordered    ibuprofen (ADVIL,MOTRIN) 600 MG tablet  Every 6 hours PRN     10/14/18 2111    methocarbamol (ROBAXIN) 500 MG tablet  Every 8 hours PRN     10/14/18 2111           Loren RacerYelverton, Jayce Kainz, MD 10/14/18  2112  

## 2019-02-21 DIAGNOSIS — Z0000 Encounter for general adult medical examination without abnormal findings: Secondary | ICD-10-CM | POA: Diagnosis not present

## 2019-02-21 DIAGNOSIS — Z13220 Encounter for screening for lipoid disorders: Secondary | ICD-10-CM | POA: Diagnosis not present

## 2019-02-21 DIAGNOSIS — R030 Elevated blood-pressure reading, without diagnosis of hypertension: Secondary | ICD-10-CM | POA: Diagnosis not present

## 2020-02-17 IMAGING — DX DG CHEST 2V
2 series · 2 of 2 positions shown · non-contrast
Comparison: None.

CLINICAL DATA: Cough, body aches, fever, nausea, headache and
weakness.

EXAM:
CHEST - 2 VIEW

[chest pa]
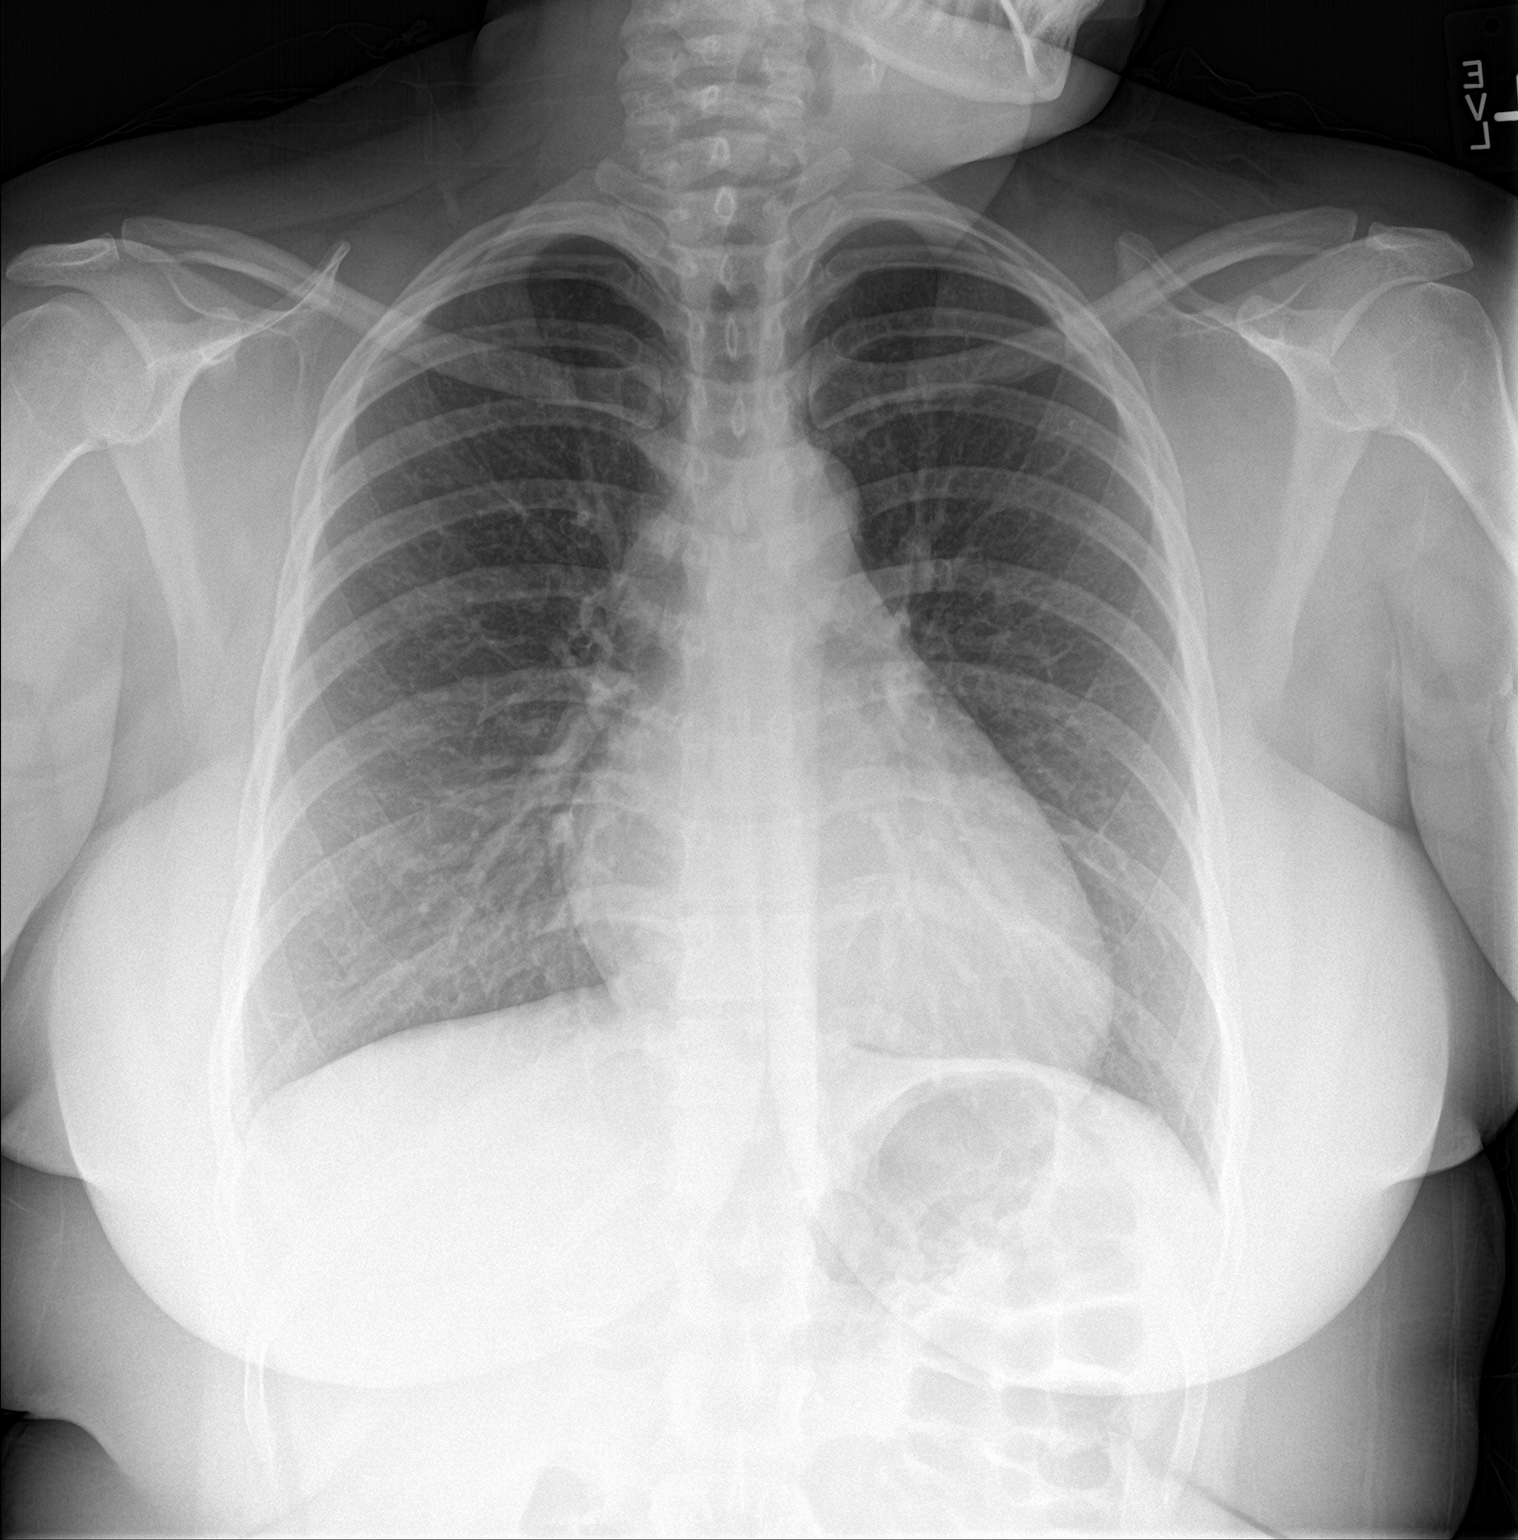

[chest lat]
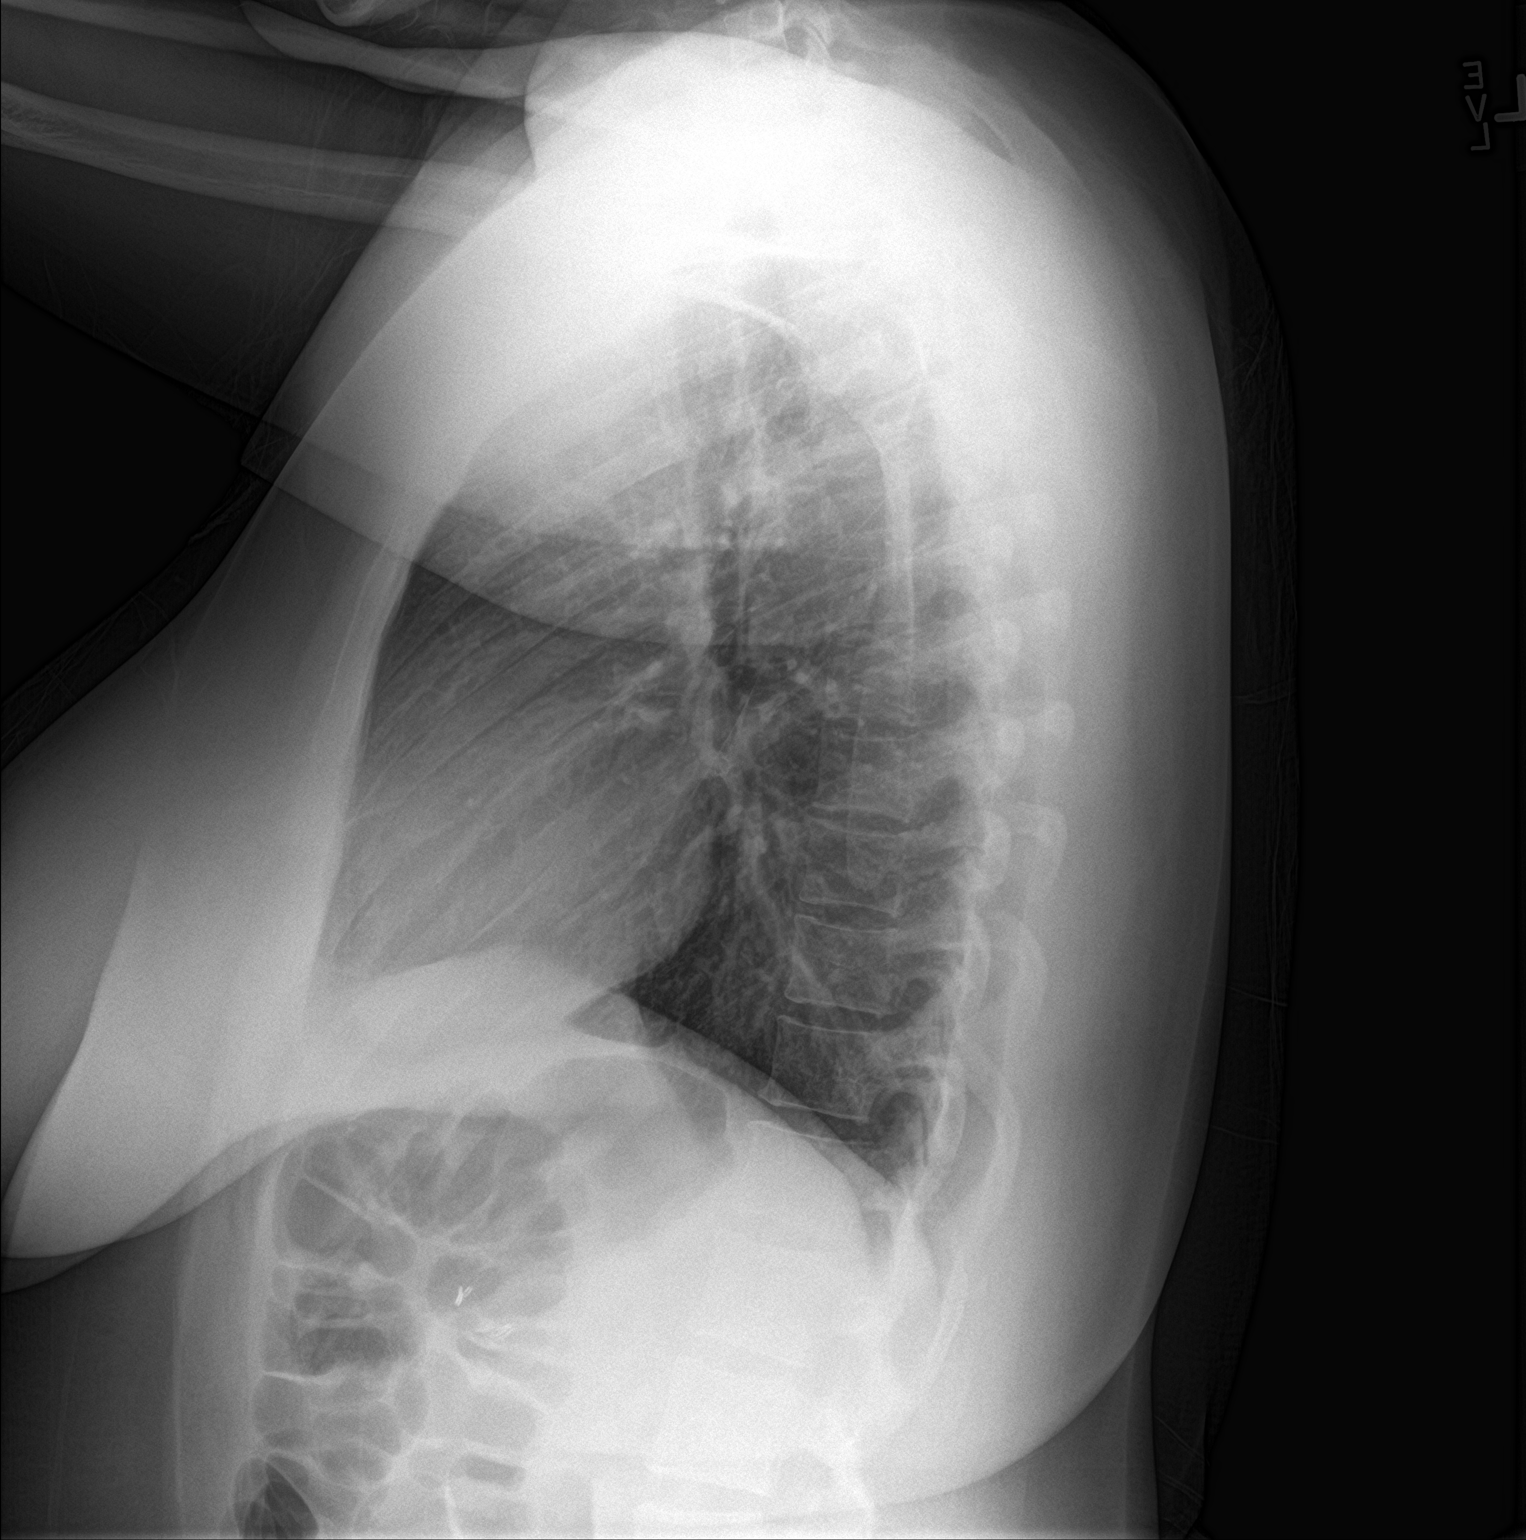

[2 of 2 positions shown; findings below may reference images not displayed]

FINDINGS: The cardiomediastinal contours are normal. The lungs are clear.
Pulmonary vasculature is normal. No consolidation, pleural effusion,
or pneumothorax. No acute osseous abnormalities are seen.
IMPRESSION: Negative radiographs of the chest.
